# Patient Record
Sex: Female | Born: 1995 | Race: White | Hispanic: No | Marital: Single | Smoking: Never smoker
Health system: Southern US, Community
[De-identification: ages and names within clinical notes are randomized; demographics above are authoritative.]

## PROBLEM LIST (undated history)

## (undated) HISTORY — PX: SHOULDER SURGERY: SHX246

## (undated) HISTORY — PX: WISDOM TOOTH EXTRACTION: SHX21

---

## 2017-05-07 ENCOUNTER — Other Ambulatory Visit: Payer: Self-pay

## 2017-05-07 ENCOUNTER — Encounter (HOSPITAL_COMMUNITY): Payer: Self-pay | Admitting: Emergency Medicine

## 2017-05-07 ENCOUNTER — Emergency Department (HOSPITAL_COMMUNITY)
Admission: EM | Admit: 2017-05-07 | Discharge: 2017-05-07 | Disposition: A | Discharge status: Home / Self Care | Payer: BLUE CROSS/BLUE SHIELD | Attending: Emergency Medicine | Admitting: Emergency Medicine

## 2017-05-07 DIAGNOSIS — J111 Influenza due to unidentified influenza virus with other respiratory manifestations: Secondary | ICD-10-CM | POA: Diagnosis not present

## 2017-05-07 DIAGNOSIS — R69 Illness, unspecified: Secondary | ICD-10-CM

## 2017-05-07 DIAGNOSIS — B349 Viral infection, unspecified: Secondary | ICD-10-CM

## 2017-05-07 DIAGNOSIS — J029 Acute pharyngitis, unspecified: Secondary | ICD-10-CM

## 2017-05-07 LAB — RAPID STREP SCREEN (MED CTR MEBANE ONLY): Streptococcus, Group A Screen (Direct): NEGATIVE

## 2017-05-07 NOTE — ED Notes (Signed)
Bed: WTR6 Expected date:  Expected time:  Means of arrival:  Comments: 

## 2017-05-07 NOTE — ED Provider Notes (Signed)
[facility]-EMERGENCY DEPT Provider Note   CSN: [id number] Arrival date & time: 05/07/17  1030     History   Chief Complaint Chief Complaint  Patient presents with  . Fever  . Nausea  . Cough    HPI Tonya Bailey is a 21 y.o. female with no past medical history presents to the ED for evaluation of rhinorrhea, nasal congestion, bilateral ear pain, sore throat, nonproductive cough, generalized body aches, nausea and vomiting, chest wall pain with coughing only 2-3 days. Had fever 200.4 yesterday but has since resolved. States all her roommates have similar symptoms. Denies trismus, drooling, difficulty swallowing, shortness of breath, generalized rash, neck pain or rigidity, diarrhea or urinary symptoms. No previous history of asthma. Has been taking over-the-counter cold medication which has provided mild relief. Currently denies nausea. Has been tolerating fluid diet without difficulty.  HPI  History reviewed. No pertinent past medical history.  There are no active problems to display for this patient.   Past Surgical History:  Procedure Laterality Date  . SHOULDER SURGERY     l/shoulder  . WISDOM TOOTH EXTRACTION      OB History    No data available       Home Medications    Prior to Admission medications   Not on File    Family History History reviewed. No pertinent family history.  Social History Social History   Tobacco Use  . Smoking status: Never Smoker  . Smokeless tobacco: Never Used  Substance Use Topics  . Alcohol use: Yes    Comment: occ  . Drug use: No     Allergies   Patient has no known allergies.   Review of Systems Review of Systems  Constitutional: Positive for appetite change, chills and fever.  HENT: Positive for congestion, rhinorrhea and sore throat.   Respiratory: Positive for cough.   Gastrointestinal: Positive for nausea and vomiting.  Musculoskeletal: Positive for myalgias.  Allergic/Immunologic:  Negative for immunocompromised state.  All other systems reviewed and are negative.    Physical Exam Updated Vital Signs BP 118/80 (BP Location: Left Arm)   Pulse 88   Temp 98.7 F (37.1 C) (Oral)   Resp 18   Wt 68 kg (150 lb)   LMP 05/07/2017 (Exact Date)   SpO2 100%   Physical Exam  Constitutional: She is oriented to person, place, and time. She appears well-developed and well-nourished. No distress.  NAD.  HENT:  Head: Normocephalic and atraumatic.  Right Ear: External ear normal.  Left Ear: External ear normal.  Nose: Rhinorrhea present.  Mouth/Throat: Posterior oropharyngeal erythema present. Tonsils are 1+ on the right. Tonsils are 1+ on the left.  Nasal mucosal erythema and mild edema with clear rhinorrhea Posterior oropharynx is erythematous Oropharynx and tonsils without edema, exudates. Uvula is midline. No trismus or pooling of oral secretions No sinus tenderness TMs normal bilaterally  Eyes: Conjunctivae and EOM are normal. No scleral icterus.  Neck: Normal range of motion. Neck supple.  No cervical adenopathy Neck is supple with painless passive range of motion No meningeal signs  Cardiovascular: Normal rate, regular rhythm and normal heart sounds.  No murmur heard. Pulmonary/Chest: Effort normal and breath sounds normal. She has no wheezes.  Lungs clear bilaterally  Abdominal: Soft. There is no tenderness.  Musculoskeletal: Normal range of motion. She exhibits no deformity.  Neurological: She is alert and oriented to person, place, and time.  Skin: Skin is warm and dry. Capillary refill takes less than 2  seconds.  Psychiatric: She has a normal mood and affect. Her behavior is normal. Judgment and thought content normal.  Nursing note and vitals reviewed.    ED Treatments / Results  Labs (all labs ordered are listed, but only abnormal results are displayed) Labs Reviewed  RAPID STREP SCREEN (NOT AT [facility])  CULTURE, GROUP A STREP [facility])    EKG  EKG  Interpretation None       Radiology No results found.  Procedures Procedures (including critical care time)  Medications Ordered in ED Medications - No data to display   Initial Impression / Assessment and Plan / ED Course  I have reviewed the triage vital signs and the nursing notes.  Pertinent labs & imaging results that were available during my care of the patient were reviewed by me and considered in my medical decision making (see chart for details).    21 y.o. -year-old female with no pmh presents with influenza like symptoms  3-4 days. All her roommates have similar symptoms. On my exam patient is nontoxic appearing, speaking in full sentences. No fever, no tachypnea, no tachycardia, normal oxygen saturations. Lungs are clear to auscultation bilaterally without wheezing, rales, egophony. I do not think that a chest x-ray is indicated at this time as vital signs are within normal limits, there are no signs of consolidation on chest exam, there is no hypoxia. Doubt bacterial bronchitis, pneumonia.  Rapid strep negative. Given reassuring physical exam and vital signs within normal limits patient will be discharged with symptomatic treatment. Strict ED return precautions given. Patient is aware that a viral URI infection may precede pneumonia. Patient is aware of red flag symptoms to monitor for that would warrant return to the ED for further reevaluation.   Final Clinical Impressions(s) / ED Diagnoses   Final diagnoses:  Influenza-like illness  Pharyngitis with viral syndrome    ED Discharge Orders    None       Liberty HandyGibbons, Ricke Kimoto J, PA-C 05/07/17 1132    Nira Connardama, Pedro Eduardo, MD 05/12/17 2357

## 2017-05-07 NOTE — ED Triage Notes (Addendum)
Pt reports fever, nausea, vomiting x 2 days.Temp of 104.0 yesterday. Tx with OTC cold medication including tylenol.  C/o sore throat x 4 days. Stated that her roommates have had mild cold symptoms this week.  Pt is alert, oriented and ambulatory. Currently afebrile, lungs clear, denies nausea

## 2017-05-07 NOTE — Discharge Instructions (Signed)
Your symptoms are most consistent with a viral upper respiratory infection, possibly influenza.  Your rapid strep test was negative.  A viral upper respiratory infection typically lasts 7-10 days. The main treatment approach is to treat the symptoms. Stay well-hydrated. Rest. Take ibuprofen or Tylenol around the clock to help with fever, sore throat and generalized body aches and malaise. Use an over-the-counter nasal steroid spray like Flonase to help with nasal congestion, runny nose and postnasal drip.  Wash your hands often to prevent spread.  Any upper respiratory infection can worsen in turn into a pneumonia. If your symptoms do not improve or you develop persistent fever, chest pain, productive thick cough in the next 2 weeks you should follow-up for a recheck and possibly a chest x-ray.

## 2017-05-09 LAB — CULTURE, GROUP A STREP (THRC)

## 2018-03-12 ENCOUNTER — Ambulatory Visit (INDEPENDENT_AMBULATORY_CARE_PROVIDER_SITE_OTHER): Payer: BLUE CROSS/BLUE SHIELD

## 2018-03-12 ENCOUNTER — Ambulatory Visit (HOSPITAL_COMMUNITY)
Admission: EM | Admit: 2018-03-12 | Discharge: 2018-03-12 | Disposition: A | Discharge status: Home / Self Care | Payer: BLUE CROSS/BLUE SHIELD | Attending: Family Medicine | Admitting: Family Medicine

## 2018-03-12 ENCOUNTER — Encounter (HOSPITAL_COMMUNITY): Payer: Self-pay | Admitting: Emergency Medicine

## 2018-03-12 DIAGNOSIS — M25562 Pain in left knee: Secondary | ICD-10-CM

## 2018-03-12 NOTE — ED Provider Notes (Signed)
[facility]    CSN: [id number] Arrival date & time: 03/12/18  1319     History   Chief Complaint Chief Complaint  Patient presents with  . Knee Pain    HPI Tonya Bailey is a 22 y.o. female no significant past medical history presenting today for evaluation of left knee pain.  Patient states that last night she was playing rugby, fell awkwardly onto her knee and believes that she dislocated her knee, she relocated this on herself.  Since she has had some soreness and pain around her knee and has been limping.  She denies further sensation of instability or dislocating her knee.  Denies previous dislocations.  Denies hearing popping clicking or catching.  HPI  History reviewed. No pertinent past medical history.  There are no active problems to display for this patient.   Past Surgical History:  Procedure Laterality Date  . SHOULDER SURGERY     l/shoulder  . WISDOM TOOTH EXTRACTION      OB History   None      Home Medications    Prior to Admission medications   Not on File    Family History No family history on file.  Social History Social History   Tobacco Use  . Smoking status: Never Smoker  . Smokeless tobacco: Never Used  Substance Use Topics  . Alcohol use: Yes    Comment: occ  . Drug use: No     Allergies   Patient has no known allergies.   Review of Systems Review of Systems  Constitutional: Negative for fatigue and fever.  Eyes: Negative for visual disturbance.  Respiratory: Negative for shortness of breath.   Cardiovascular: Negative for chest pain.  Gastrointestinal: Negative for abdominal pain, nausea and vomiting.  Musculoskeletal: Positive for arthralgias, gait problem, joint swelling and myalgias.  Skin: Positive for color change. Negative for rash and wound.  Neurological: Negative for dizziness, weakness, light-headedness and headaches.     Physical Exam Triage Vital Signs ED Triage Vitals [03/12/18 1332]  Enc  Vitals Group     BP 117/64     Pulse Rate 61     Resp 16     Temp 98.1 F (36.7 C)     Temp src      SpO2 100 %     Weight      Height      Head Circumference      Peak Flow      Pain Score      Pain Loc      Pain Edu?      Excl. in GC?    No data found.  Updated Vital Signs BP 117/64   Pulse 61   Temp 98.1 F (36.7 C)   Resp 16   LMP 02/12/2018   SpO2 100%   Visual Acuity Right Eye Distance:   Left Eye Distance:   Bilateral Distance:    Right Eye Near:   Left Eye Near:    Bilateral Near:     Physical Exam  Constitutional: She appears well-developed and well-nourished. No distress.  HENT:  Head: Normocephalic and atraumatic.  Eyes: Conjunctivae are normal.  Neck: Neck supple.  Cardiovascular: Normal rate and regular rhythm.  No murmur heard. Pulmonary/Chest: Effort normal and breath sounds normal. No respiratory distress.  Abdominal: Soft. There is no tenderness.  Musculoskeletal: She exhibits no edema.  Multiple bruising in different stages of healing on bilateral lower extremities, bruising specifically to medial/inferior aspect around patella, tenderness  to palpation, patella easily shifted laterally and medially, stays in line with flexion, full active range of motion of the knee, no varus or valgus stress appreciated, negative Lachman's.  Neurological: She is alert.  Skin: Skin is warm and dry.  Psychiatric: She has a normal mood and affect.  Nursing note and vitals reviewed.    UC Treatments / Results  Labs (all labs ordered are listed, but only abnormal results are displayed) Labs Reviewed - No data to display  EKG None  Radiology Dg Knee Complete 4 Views Left  Result Date: 03/12/2018 CLINICAL DATA:  Left knee pain. The patient reports that she "dislocated her knee" while playing rugby yesterday and reduced it herself. EXAM: LEFT KNEE - COMPLETE 4+ VIEW COMPARISON:  None. FINDINGS: There is no fracture or dislocation or appreciable joint  effusion. Soft tissue edema adjacent to the lateral and superior aspect of the patella. IMPRESSION: No acute bone abnormality.  Soft tissue edema, nonspecific. Electronically Signed   By: Francene Boyers M.D.   On: 03/12/2018 13:45    Procedures Procedures (including critical care time)  Medications Ordered in UC Medications - No data to display  Initial Impression / Assessment and Plan / UC Course  I have reviewed the triage vital signs and the nursing notes.  Pertinent labs & imaging results that were available during my care of the patient were reviewed by me and considered in my medical decision making (see chart for details).     X-ray normal, no dislocation.  Will provide patient with patellar stabilization brace, advised to rest/no rugby for 2 weeks, ice, anti-inflammatories, follow-up with Ortho symptoms persisting or having recurrent dislocations.Discussed strict return precautions. Patient verbalized understanding and is agreeable with plan.  Final Clinical Impressions(s) / UC Diagnoses   Final diagnoses:  Acute pain of left knee     Discharge Instructions     X-ray.  Normal Please wear knee brace for the next 2 weeks; please sit out of rugby for 2 weeks as well as knee healing Ice and rest your knee Use anti-inflammatories for pain/swelling. You may take up to 800 mg Ibuprofen every 8 hours with food. You may supplement Ibuprofen with Tylenol [phone] mg every 8 hours.   Please follow-up with orthopedics if having recurrent knee dislocations.    ED Prescriptions    None     Controlled Substance Prescriptions [place] Controlled Substance Registry consulted? Not Applicable   Lew Dawes, [place] 03/12/18 1419

## 2018-03-12 NOTE — Discharge Instructions (Addendum)
X-ray.  Normal Please wear knee brace for the next 2 weeks; please sit out of rugby for 2 weeks as well as knee healing Ice and rest your knee Use anti-inflammatories for pain/swelling. You may take up to 800 mg Ibuprofen every 8 hours with food. You may supplement Ibuprofen with Tylenol [phone] mg every 8 hours.   Please follow-up with orthopedics if having recurrent knee dislocations.

## 2018-03-12 NOTE — ED Triage Notes (Signed)
Pt states she was playing rugby last night and fell and felt like she dislocated her L knee. Pt limping.

## 2019-06-29 ENCOUNTER — Other Ambulatory Visit: Payer: Self-pay

## 2019-06-29 DIAGNOSIS — Z20822 Contact with and (suspected) exposure to covid-19: Secondary | ICD-10-CM

## 2019-06-30 LAB — NOVEL CORONAVIRUS, NAA: SARS-CoV-2, NAA: NOT DETECTED

## 2019-09-08 ENCOUNTER — Ambulatory Visit: Payer: Self-pay | Attending: Internal Medicine

## 2019-09-08 DIAGNOSIS — Z23 Encounter for immunization: Secondary | ICD-10-CM

## 2019-09-08 NOTE — Progress Notes (Signed)
   Covid-19 Vaccination Clinic  Name:  Tonya Bailey    MRN: [id number] DOB: 1995/10/05  09/08/2019  [name] was observed post Covid-19 immunization for 15 minutes without incident. She was provided with Vaccine Information Sheet and instruction to access the V-Safe system.   [name] was instructed to call 911 with any severe reactions post vaccine: Marland Kitchen Difficulty breathing  . Swelling of face and throat  . A fast heartbeat  . A bad rash all over body  . Dizziness and weakness   Immunizations Administered    Name Date Dose VIS Date Route   Pfizer COVID-19 Vaccine 09/08/2019  3:20 PM 0.3 mL 06/03/2019 Intramuscular   Manufacturer: [employer], [employer]   Lot: [id number]   NDC: [address]-4

## 2019-10-04 ENCOUNTER — Ambulatory Visit: Payer: Self-pay | Attending: Internal Medicine

## 2019-10-04 DIAGNOSIS — Z23 Encounter for immunization: Secondary | ICD-10-CM

## 2019-10-04 NOTE — Progress Notes (Signed)
   Covid-19 Vaccination Clinic  Name:  Tonya Bailey    MRN: [id number] DOB: [birth year]  10/04/2019  [name] was observed post Covid-19 immunization for 15 minutes without incident. She was provided with Vaccine Information Sheet and instruction to access the V-Safe system.   [name] was instructed to call 911 with any severe reactions post vaccine: Marland Kitchen Difficulty breathing  . Swelling of face and throat  . A fast heartbeat  . A bad rash all over body  . Dizziness and weakness   Immunizations Administered    Name Date Dose VIS Date Route   Pfizer COVID-19 Vaccine 10/04/2019  3:10 PM 0.3 mL 06/03/2019 Intramuscular   Manufacturer: [employer], [employer]   Lot: [id number]   NDC: [address]-4

## 2020-04-06 IMAGING — DX DG KNEE COMPLETE 4+V*L*
4 series · 4 of 4 positions shown · non-contrast
Comparison: None.

CLINICAL DATA: Left knee pain. The patient reports that she
"dislocated her knee" while playing rugby yesterday and reduced it
herself.

EXAM:
LEFT KNEE - COMPLETE 4+ VIEW

[knee ap]
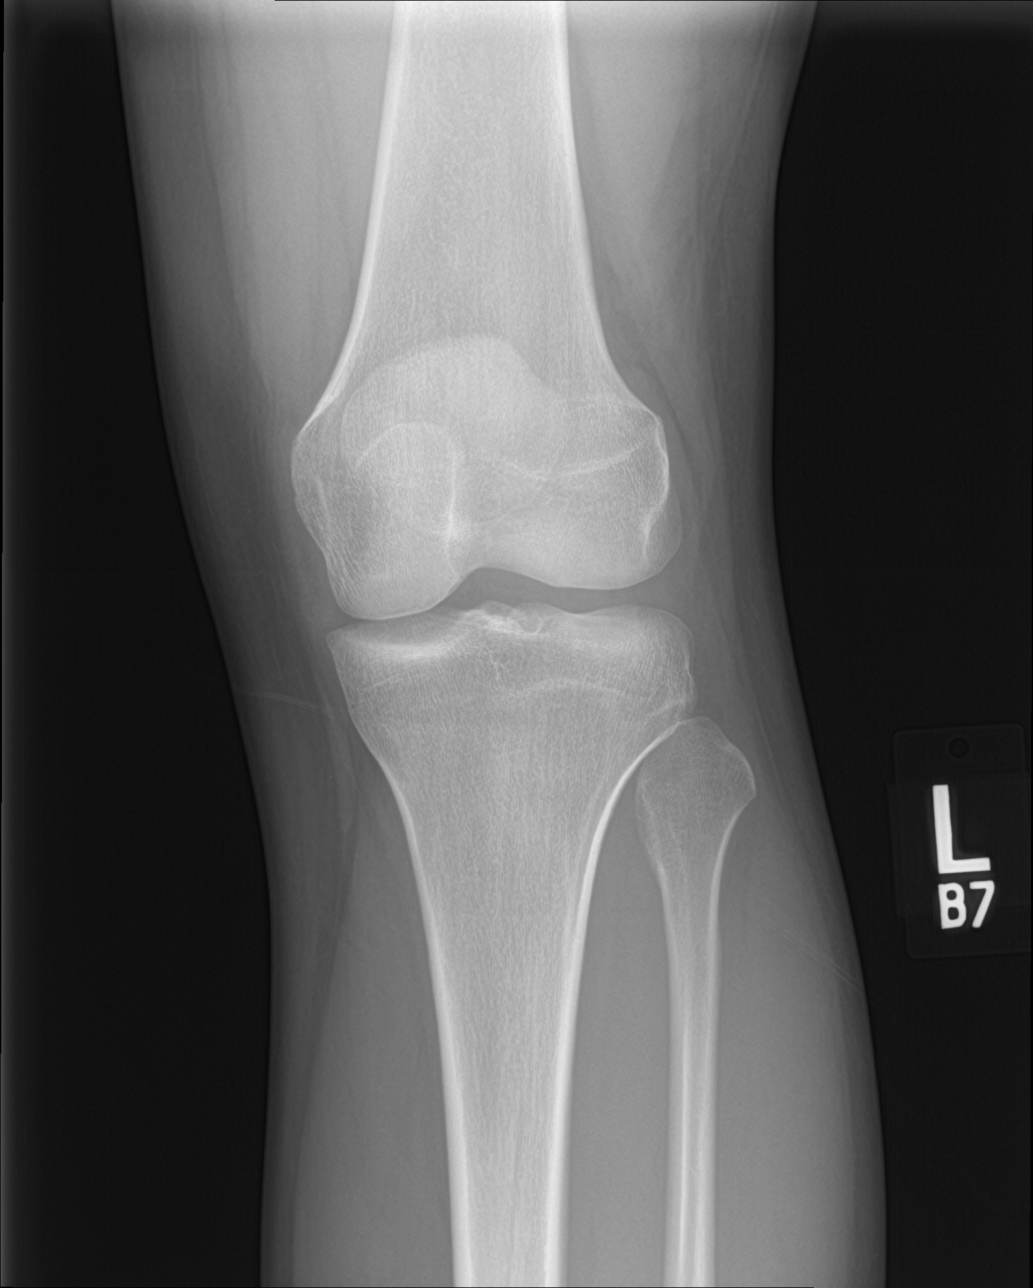

[knee obl (1 of 2)]
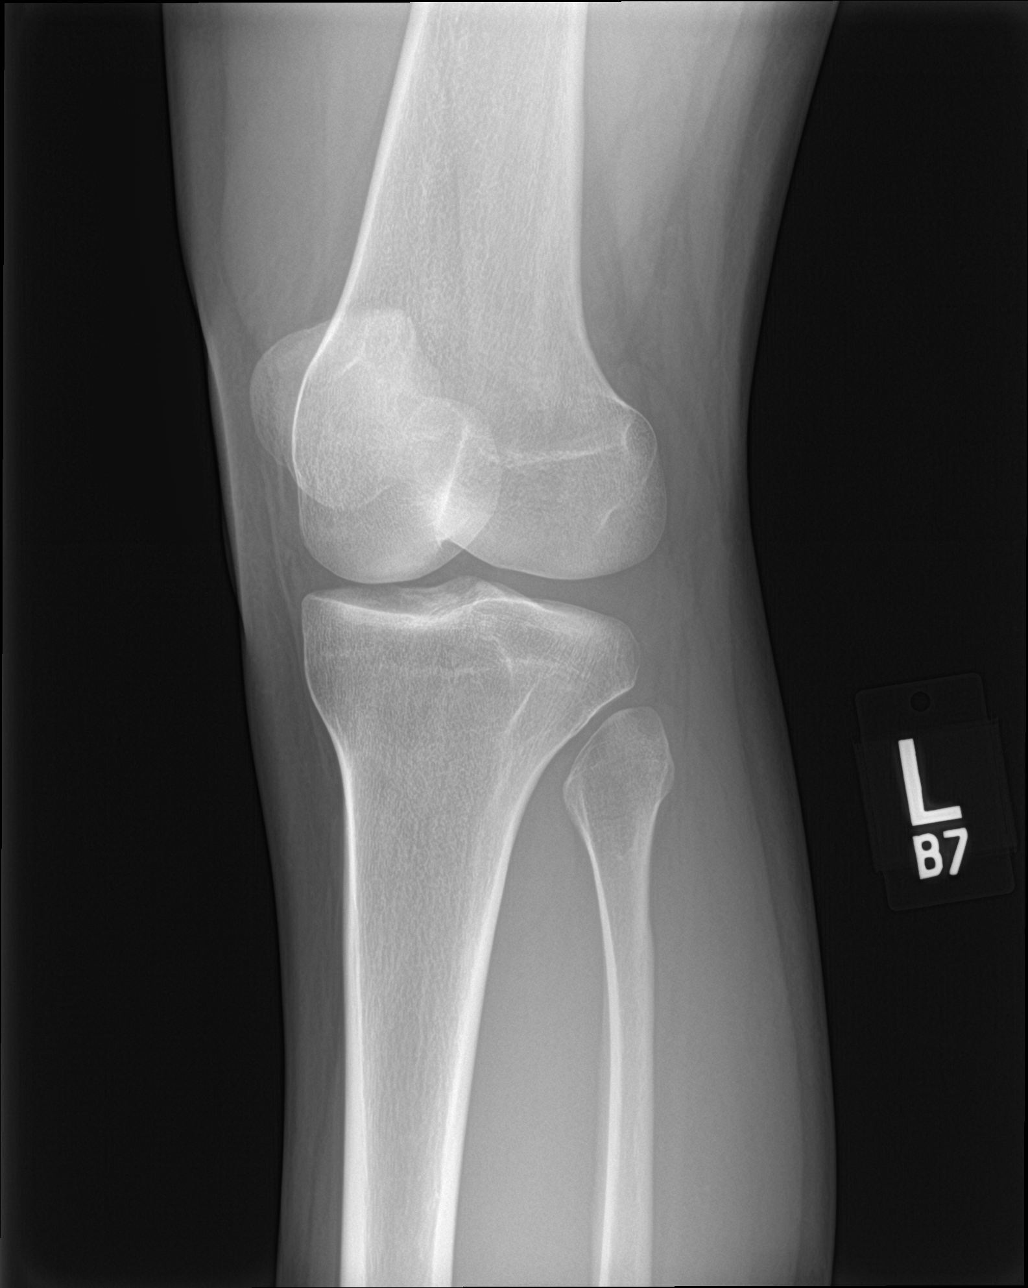

[knee obl (2 of 2)]
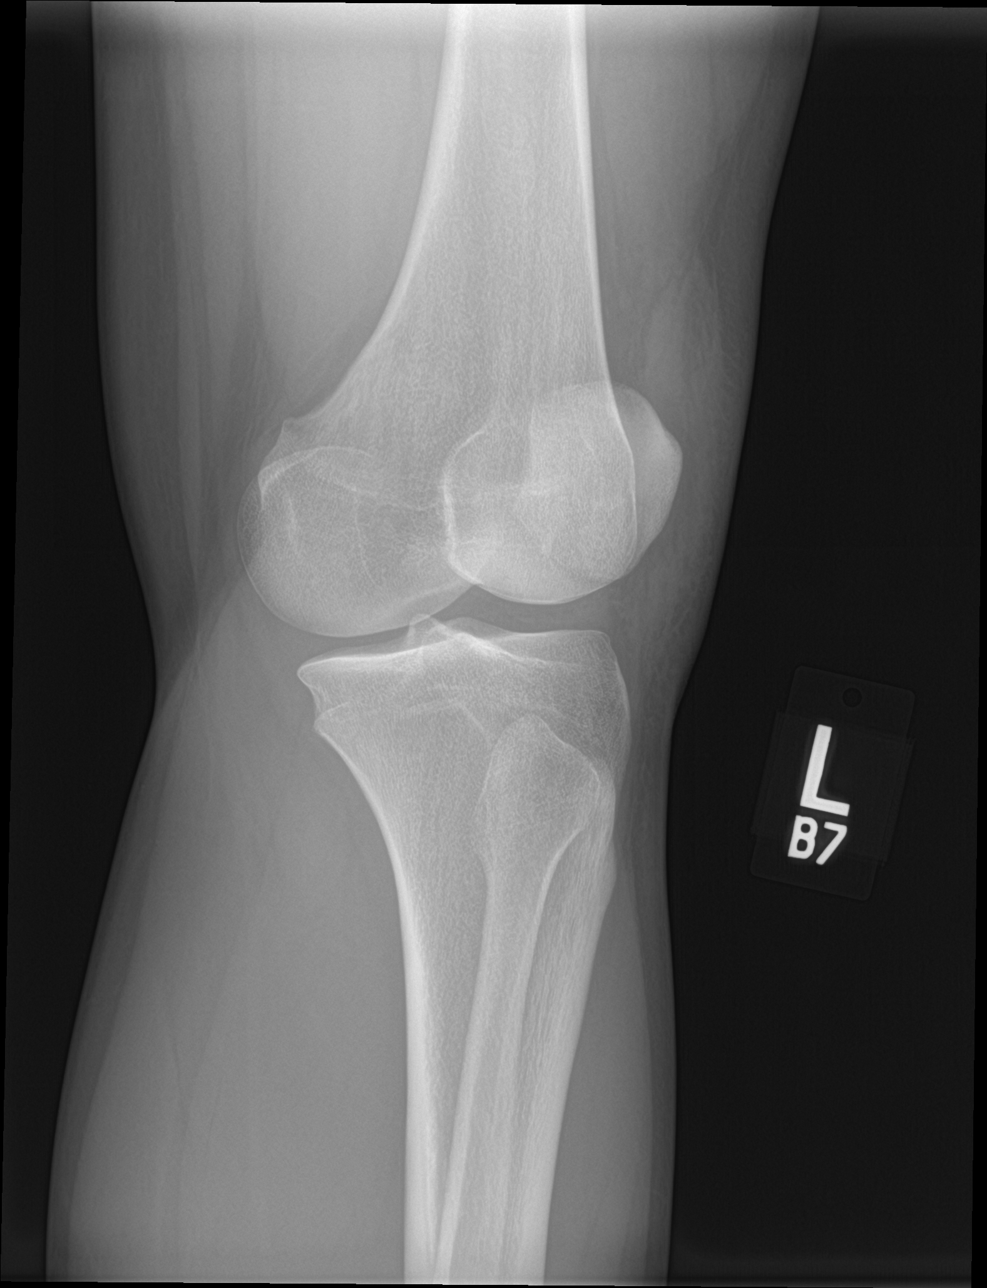

[knee lat]
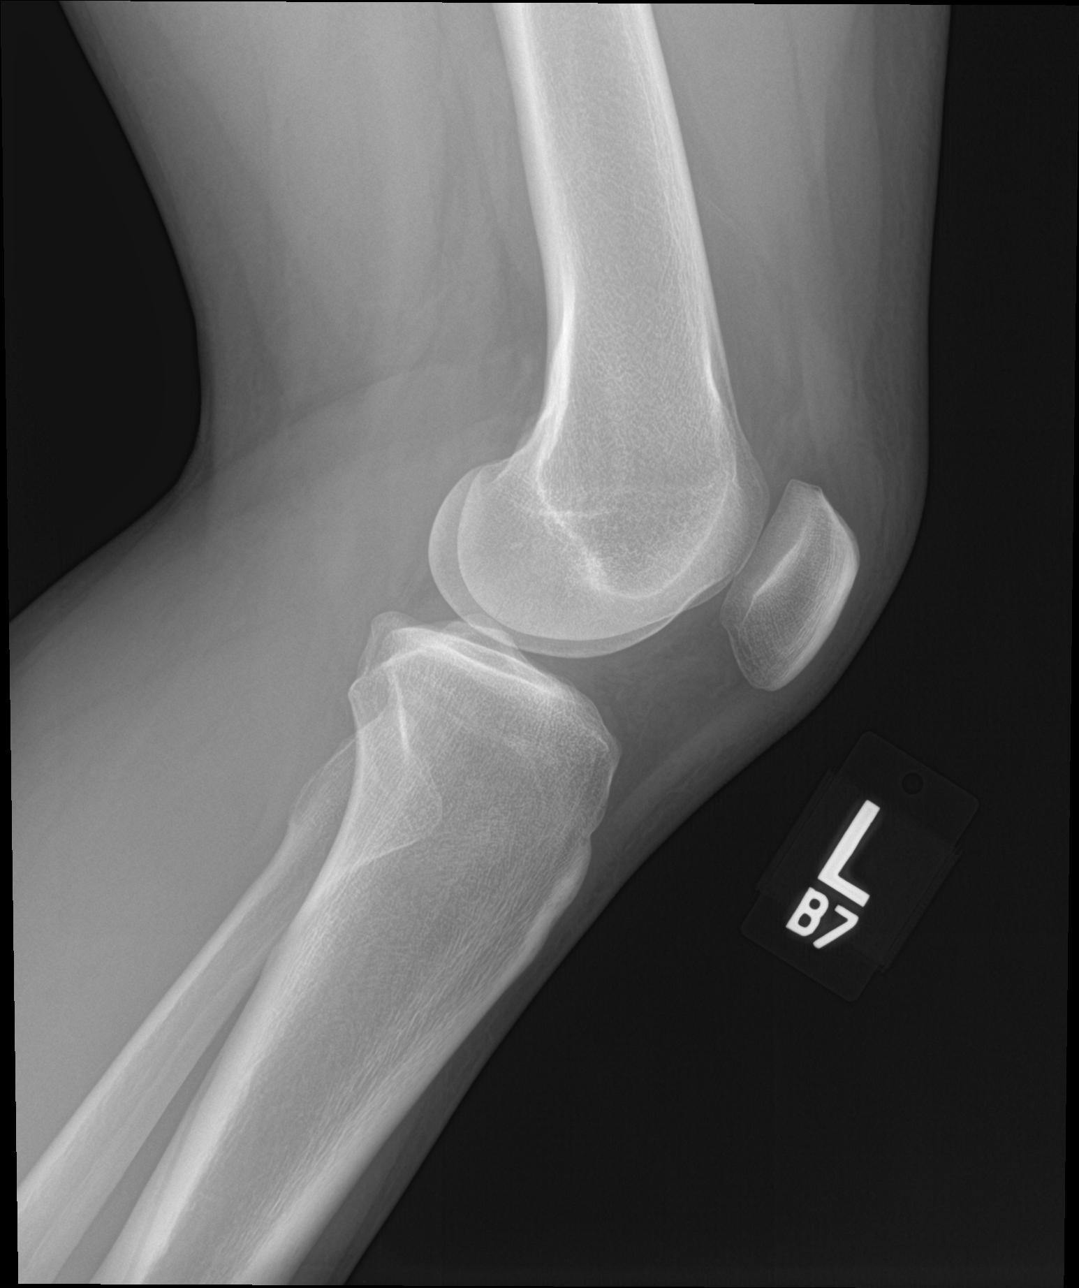

[4 of 4 positions shown; findings below may reference images not displayed]

FINDINGS: There is no fracture or dislocation or appreciable joint effusion.
Soft tissue edema adjacent to the lateral and superior aspect of the
patella.
IMPRESSION: No acute bone abnormality.  Soft tissue edema, nonspecific.

## 2021-02-01 ENCOUNTER — Emergency Department (HOSPITAL_COMMUNITY)
Admission: EM | Admit: 2021-02-01 | Discharge: 2021-02-01 | Disposition: A | Discharge status: Home / Self Care | Payer: Self-pay | Attending: Emergency Medicine | Admitting: Emergency Medicine

## 2021-02-01 ENCOUNTER — Encounter (HOSPITAL_COMMUNITY): Payer: Self-pay | Admitting: Emergency Medicine

## 2021-02-01 DIAGNOSIS — K91840 Postprocedural hemorrhage and hematoma of a digestive system organ or structure following a digestive system procedure: Secondary | ICD-10-CM | POA: Insufficient documentation

## 2021-02-01 LAB — CBC WITH DIFFERENTIAL/PLATELET
Abs Immature Granulocytes: 0.01 10*3/uL (ref 0.00–0.07)
Basophils Absolute: 0 10*3/uL (ref 0.0–0.1)
Basophils Relative: 1 %
Eosinophils Absolute: 0.1 10*3/uL (ref 0.0–0.5)
Eosinophils Relative: 2 %
HCT: 47.8 % — ABNORMAL HIGH (ref 36.0–46.0)
Hemoglobin: 16 g/dL — ABNORMAL HIGH (ref 12.0–15.0)
Immature Granulocytes: 0 %
Lymphocytes Relative: 22 %
Lymphs Abs: 1.3 10*3/uL (ref 0.7–4.0)
MCH: 28.8 pg (ref 26.0–34.0)
MCHC: 33.5 g/dL (ref 30.0–36.0)
MCV: 86.1 fL (ref 80.0–100.0)
Monocytes Absolute: 0.5 10*3/uL (ref 0.1–1.0)
Monocytes Relative: 9 %
Neutro Abs: 3.9 10*3/uL (ref 1.7–7.7)
Neutrophils Relative %: 66 %
Platelets: 212 10*3/uL (ref 150–400)
RBC: 5.55 MIL/uL — ABNORMAL HIGH (ref 3.87–5.11)
RDW: 12.4 % (ref 11.5–15.5)
WBC: 5.8 10*3/uL (ref 4.0–10.5)
nRBC: 0 % (ref 0.0–0.2)

## 2021-02-01 LAB — APTT: aPTT: 37 seconds — ABNORMAL HIGH (ref 24–36)

## 2021-02-01 LAB — PROTIME-INR
INR: 1.2 (ref 0.8–1.2)
Prothrombin Time: 15.1 seconds (ref 11.4–15.2)

## 2021-02-01 LAB — BASIC METABOLIC PANEL
Anion gap: 12 (ref 5–15)
BUN: 20 mg/dL (ref 6–20)
CO2: 26 mmol/L (ref 22–32)
Calcium: 9.7 mg/dL (ref 8.9–10.3)
Chloride: 101 mmol/L (ref 98–111)
Creatinine, Ser: 1.21 mg/dL — ABNORMAL HIGH (ref 0.44–1.00)
GFR, Estimated: >60 mL/min (ref >60)
Glucose, Bld: 81 mg/dL (ref 70–99)
Potassium: 3.6 mmol/L (ref 3.5–5.1)
Sodium: 139 mmol/L (ref 135–145)

## 2021-02-01 MED ORDER — LIDOCAINE HCL (PF) 1 % IJ SOLN: 5.0000 mL | Freq: Once | INTRAMUSCULAR | Status: DC

## 2021-02-01 MED ORDER — TRANEXAMIC ACID FOR EPISTAXIS
500.0000 mg | Freq: Once | TOPICAL | Status: AC
  Administered 2021-02-01: 500 mg via TOPICAL
  Filled 2021-02-01: qty 10

## 2021-02-01 MED ORDER — LIDOCAINE-EPINEPHRINE 2 %-1:100000 IJ SOLN
10.0000 mL | Freq: Once | INTRAMUSCULAR | Status: AC
  Administered 2021-02-01: 10 mL
  Filled 2021-02-01: qty 1

## 2021-02-01 NOTE — ED Provider Notes (Signed)
[facility]EMERG[facility]PT Provider Note   CSN: [id number] Arrival date & time: 02/01/21  1609     History Chief Complaint  Patient presents with   Dental Pain    Deklyn Trachtenberg is a 25 y.o. adult presenting for evaluation of dental bleeding.  Patient states he had his left upper tooth extracted 2 days ago.  Since then, he has continued to have bleeding.  He has called the dentist multiple times, been using packed gauze and teabags, however continues to have bleeding.  He denies significant pain.  No fevers or chills.  Patient states he was told to come to the ER as a dentist was concerned that he had a bleeding disorder and recommended TXA to help with bleeding. Patient has no medical problems, takes no medications daily.  No previous history of significant bleeding.  HPI     History reviewed. No pertinent past medical history.  There are no problems to display for this patient.   Past Surgical History:  Procedure Laterality Date   SHOULDER SURGERY     l/shoulder   WISDOM TOOTH EXTRACTION       OB History   No obstetric history on file.     History reviewed. No pertinent family history.  Social History   Tobacco Use   Smoking status: Never   Smokeless tobacco: Never  Substance Use Topics   Alcohol use: Yes    Comment: occ   Drug use: No    Home Medications Prior to Admission medications   Not on File    Allergies    Patient has no known allergies.  Review of Systems   Review of Systems  Constitutional:  Negative for fever.  HENT:  Positive for dental problem.    Physical Exam Updated Vital Signs BP 129/85   Pulse 78   Temp 98.8 F (37.1 C) (Oral)   Resp 18   SpO2 100%   Physical Exam Vitals and nursing note reviewed.  Constitutional:      General: He is not in acute distress.    Appearance: He is well-developed.  HENT:     Head: Normocephalic and atraumatic.     Mouth/Throat:      Comments: Left upper tooth  extracted.  No significant bleeding from this area.  There is some mild oozing/bleeding from the gingiva of the tooth next to this area. Eyes:     Extraocular Movements: Extraocular movements intact.  Cardiovascular:     Rate and Rhythm: Normal rate.  Pulmonary:     Effort: Pulmonary effort is normal.  Abdominal:     General: There is no distension.  Musculoskeletal:        General: Normal range of motion.     Cervical back: Normal range of motion.  Skin:    General: Skin is warm.     Findings: No rash.  Neurological:     Mental Status: He is alert and oriented to person, place, and time.     ED Results / Procedures / Treatments   Labs (all labs ordered are listed, but only abnormal results are displayed) Labs Reviewed  CBC WITH DIFFERENTIAL/PLATELET - Abnormal; Notable for the following components:      Result Value   RBC 5.55 (*)    Hemoglobin 16.0 (*)    HCT 47.8 (*)    All other components within normal limits  BASIC METABOLIC PANEL - Abnormal; Notable for the following components:   Creatinine, Ser 1.21 (*)  All other components within normal limits  APTT - Abnormal; Notable for the following components:   aPTT 37 (*)    All other components within normal limits  PROTIME-INR    EKG None  Radiology No results found.  Procedures Procedures   Medications Ordered in ED Medications  tranexamic acid (CYKLOKAPRON) 1000 MG/10ML topical solution 500 mg (500 mg Topical Given 02/01/21 1701)  lidocaine-EPINEPHrine (XYLOCAINE W/EPI) 2 %-1:100000 (with pres) injection 10 mL (10 mLs Other Given 02/01/21 1952)    ED Course  I have reviewed the triage vital signs and the nursing notes.  Pertinent labs & imaging results that were available during my care of the patient were reviewed by me and considered in my medical decision making (see chart for details).    MDM Rules/Calculators/A&P                           Patient presenting for evaluation of continued bleeding  after dental extraction.  On exam, patient appears nontoxic.  He does have some continued oozing from the left upper tooth.  Will check blood work to ensure no anemia, thrombocytopenia, or other concerning findings.  Will apply TXA to a gauze and packed the area.  Labs interpreted by me, no signs of bleeding disorder. On reevaluation, pt with continued bleeding. Still coming mostly from the tooth next to the extraction. Discussed with attending, Dr. Posey Rea evaluated the pt. Will consult with dentistry (OMFS not available at this time).   Dr. Mia Creek came to evaluate the pt and placed stitch at the site of bleeding. Pt with decreased bleeding and able to drink without difficulty. At this time, pt appears safe for d/c. Return precautions given. Pt states he understands and agrees to plan.   Final Clinical Impression(s) / ED Diagnoses Final diagnoses:  Surgical wound hemorrhage after dental procedure    Rx / DC Orders ED Discharge Orders     None        Alveria Apley, PA-C 02/01/21 2138    Glendora Score, MD 02/02/21 0031

## 2021-02-01 NOTE — ED Triage Notes (Signed)
Patient here from home reporting tooth extraction to left side with uncontrollable bleeding. Dentist worried about clotting issue.
